# Patient Record
Sex: Female | Born: 1985 | Race: White | Hispanic: No | Smoking: Current every day smoker
Health system: Southern US, Community
[De-identification: ages and names within clinical notes are randomized; demographics above are authoritative.]

## PROBLEM LIST (undated history)

## (undated) HISTORY — PX: MANDIBLE SURGERY: SHX707

## (undated) HISTORY — PX: CHOLECYSTECTOMY: SHX55

---
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
## 2005-12-06 ENCOUNTER — Emergency Department: Payer: Self-pay | Admitting: Unknown Physician Specialty

## 2007-10-24 ENCOUNTER — Emergency Department: Payer: Self-pay | Admitting: Emergency Medicine

## 2007-11-10 ENCOUNTER — Emergency Department: Payer: Self-pay

## 2008-06-05 ENCOUNTER — Emergency Department: Payer: Self-pay | Admitting: Emergency Medicine

## 2009-08-20 ENCOUNTER — Emergency Department: Payer: Self-pay | Admitting: Emergency Medicine

## 2009-10-28 ENCOUNTER — Observation Stay: Payer: Self-pay | Admitting: Surgery

## 2010-05-24 ENCOUNTER — Emergency Department: Payer: Self-pay | Admitting: Emergency Medicine

## 2010-12-06 ENCOUNTER — Emergency Department: Payer: Self-pay | Admitting: Emergency Medicine

## 2011-03-14 ENCOUNTER — Emergency Department: Payer: Self-pay | Admitting: Emergency Medicine

## 2011-03-14 LAB — COMPREHENSIVE METABOLIC PANEL
Albumin: 4.1 g/dL (ref 3.4–5.0)
BUN: 7 mg/dL (ref 7–18)
Calcium, Total: 9.2 mg/dL (ref 8.5–10.1)
Chloride: 106 mmol/L (ref 98–107)
Co2: 25 mmol/L (ref 21–32)
EGFR (African American): >60
EGFR (Non-African Amer.): >60
Glucose: 92 mg/dL (ref 65–99)
SGOT(AST): 16 U/L (ref 15–37)
SGPT (ALT): 21 U/L
Total Protein: 8 g/dL (ref 6.4–8.2)

## 2011-03-14 LAB — HCG, QUANTITATIVE, PREGNANCY: Beta Hcg, Quant.: 31703 m[IU]/mL — ABNORMAL HIGH

## 2011-03-14 LAB — URINALYSIS, COMPLETE
Bilirubin,UR: NEGATIVE
Blood: NEGATIVE
Glucose,UR: NEGATIVE mg/dL (ref 0–75)
Ketone: NEGATIVE
Leukocyte Esterase: NEGATIVE
Ph: 7 (ref 4.5–8.0)
Protein: NEGATIVE
RBC,UR: 2 /HPF (ref 0–5)
Specific Gravity: 1.019 (ref 1.003–1.030)
Squamous Epithelial: 4

## 2011-03-14 LAB — CBC
HGB: 12.8 g/dL (ref 12.0–16.0)
MCH: 30 pg (ref 26.0–34.0)
Platelet: 141 10*3/uL — ABNORMAL LOW (ref 150–440)
RBC: 4.28 10*6/uL (ref 3.80–5.20)
RDW: 14 % (ref 11.5–14.5)
WBC: 6.9 10*3/uL (ref 3.6–11.0)

## 2011-05-13 ENCOUNTER — Emergency Department: Payer: Self-pay | Admitting: Internal Medicine

## 2011-09-23 ENCOUNTER — Observation Stay: Payer: Self-pay | Admitting: Obstetrics and Gynecology

## 2011-09-23 LAB — WET PREP, GENITAL

## 2011-09-23 LAB — URINALYSIS, COMPLETE
Blood: NEGATIVE
Glucose,UR: NEGATIVE mg/dL (ref 0–75)
Leukocyte Esterase: NEGATIVE
Nitrite: NEGATIVE
Protein: NEGATIVE
RBC,UR: 1 /HPF (ref 0–5)
Specific Gravity: 1.019 (ref 1.003–1.030)
Squamous Epithelial: 2

## 2011-09-23 LAB — FETAL FIBRONECTIN: Appearance: NORMAL

## 2011-09-24 ENCOUNTER — Ambulatory Visit: Payer: Self-pay | Admitting: Obstetrics & Gynecology

## 2011-11-08 ENCOUNTER — Observation Stay: Payer: Self-pay | Admitting: Obstetrics & Gynecology

## 2011-11-09 ENCOUNTER — Inpatient Hospital Stay: Payer: Self-pay | Admitting: Obstetrics & Gynecology

## 2011-11-09 LAB — CBC WITH DIFFERENTIAL/PLATELET
Basophil %: 0.3 %
Eosinophil #: 0.1 10*3/uL (ref 0.0–0.7)
Eosinophil %: 1 %
HCT: 35.1 % (ref 35.0–47.0)
Lymphocyte #: 2.4 10*3/uL (ref 1.0–3.6)
MCH: 30.6 pg (ref 26.0–34.0)
MCHC: 34.3 g/dL (ref 32.0–36.0)
MCV: 89 fL (ref 80–100)
Monocyte #: 1 x10 3/mm — ABNORMAL HIGH (ref 0.2–0.9)
Monocyte %: 7.8 %
Neutrophil #: 8.9 10*3/uL — ABNORMAL HIGH (ref 1.4–6.5)
Neutrophil %: 71.7 %
RBC: 3.93 10*6/uL (ref 3.80–5.20)
RDW: 13.6 % (ref 11.5–14.5)
WBC: 12.4 10*3/uL — ABNORMAL HIGH (ref 3.6–11.0)

## 2011-11-10 LAB — HEMATOCRIT: HCT: 31.1 % — ABNORMAL LOW (ref 35.0–47.0)

## 2011-11-15 LAB — PATHOLOGY REPORT

## 2013-02-14 IMAGING — US US OB < 14 WEEKS - US OB TV
1 series · 17 of 28 positions shown · non-contrast
Comparison: none

REASON FOR EXAM: Pain & Vomiting with Pelvic pain. 6 weeks OB
COMMENTS:

PROCEDURE:     US  - US OB LESS THAN 14 WEEKS/W TRANS  - March 14, 2011  [DATE]
RESULT:     First trimester OB ultrasound dated tube 12/21/1911.

[Series 1: us ob < 14 weeks - us ob tv · 17 of 102 slices shown]
[im 1/102]
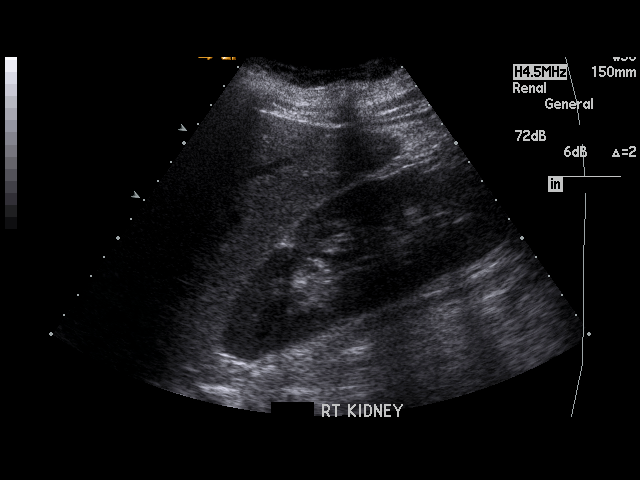
[im 8/102]
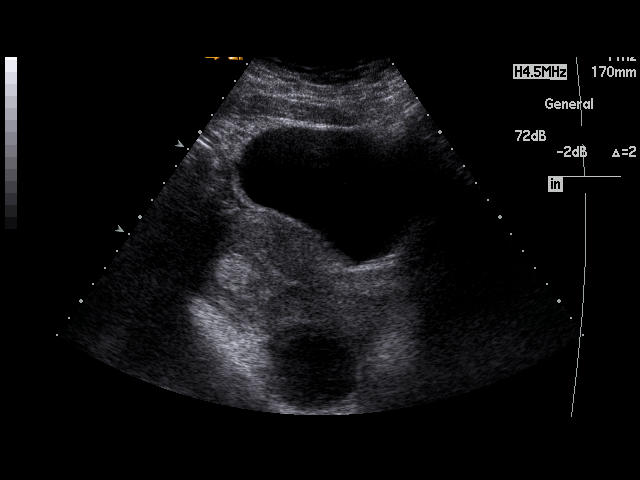
[im 15/102]
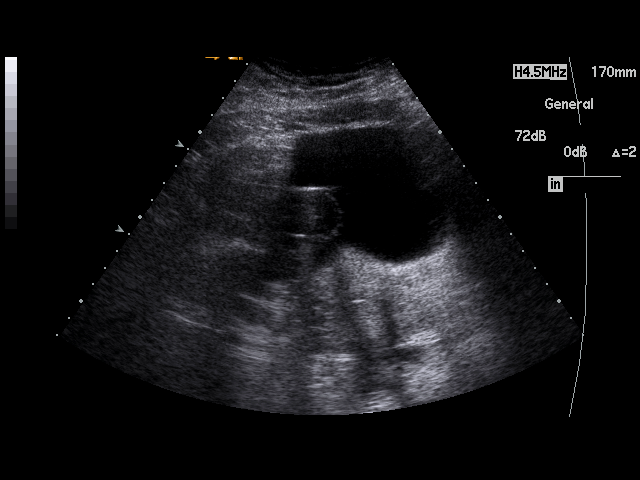
[im 19/102]
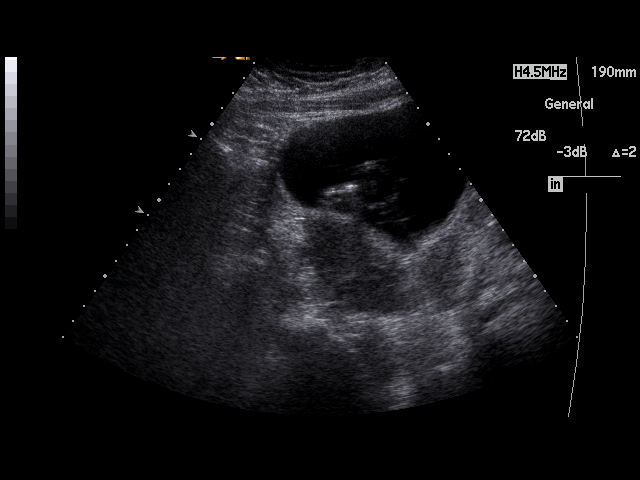
[im 27/102]
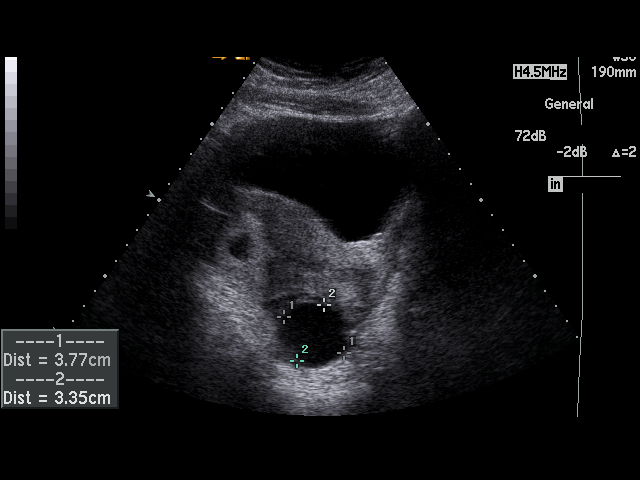
[im 34/102]
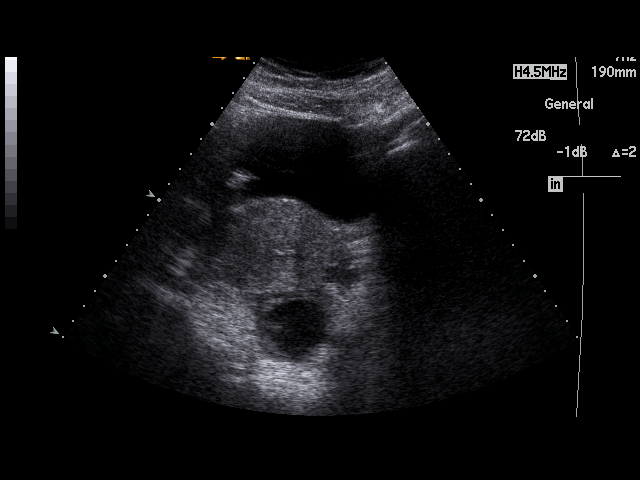
[im 38/102]
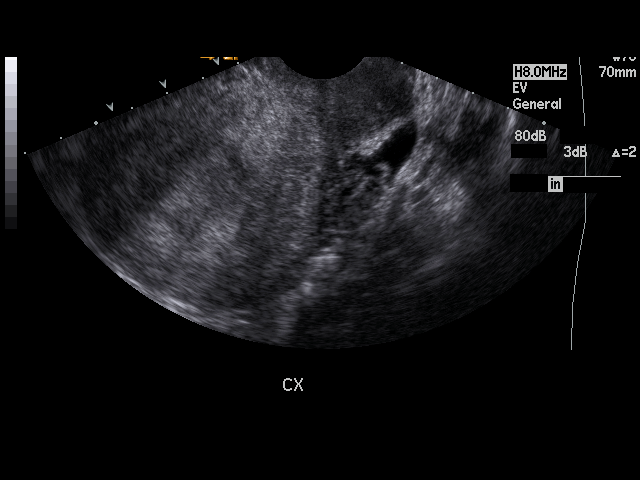
[im 45/102]
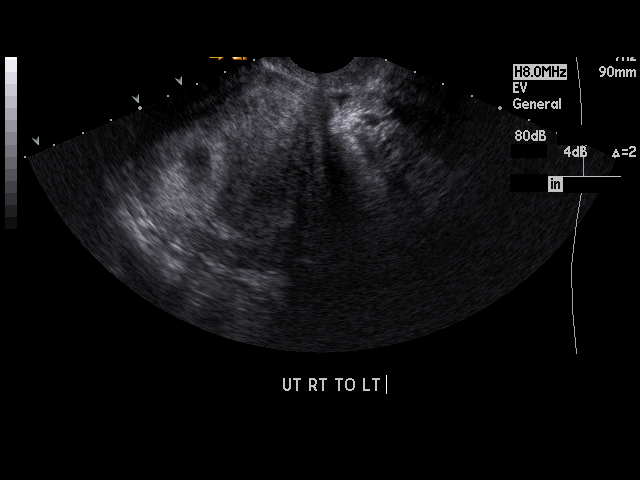
[im 53/102]
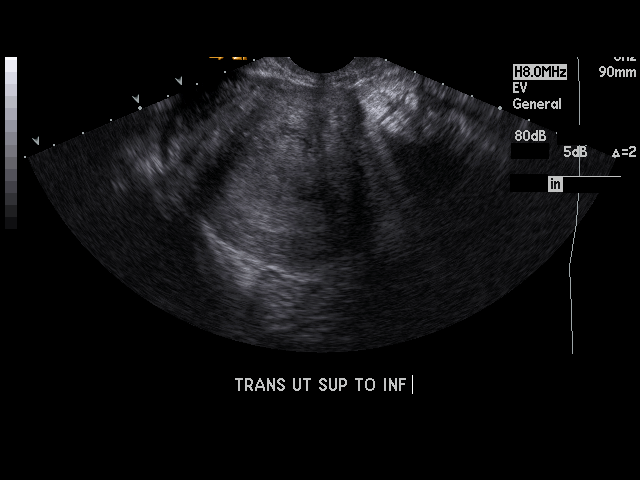
[im 57/102]
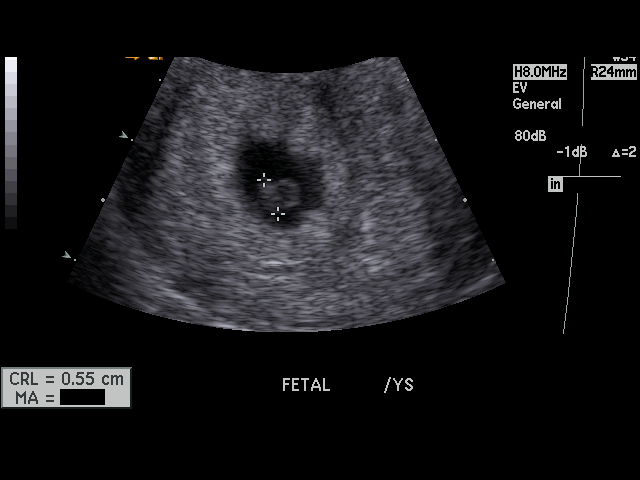
[im 64/102]
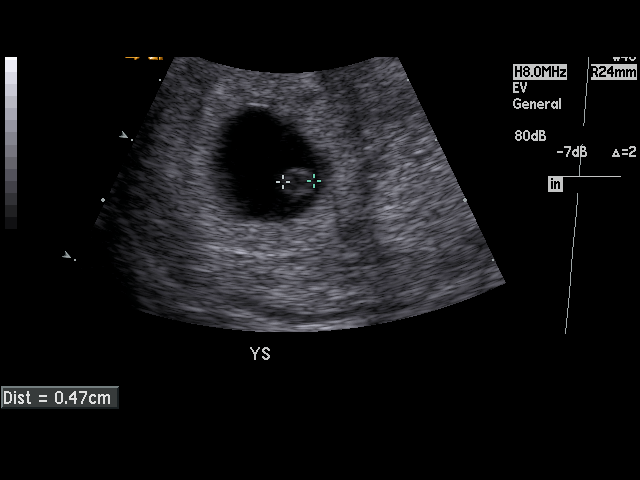
[im 68/102]
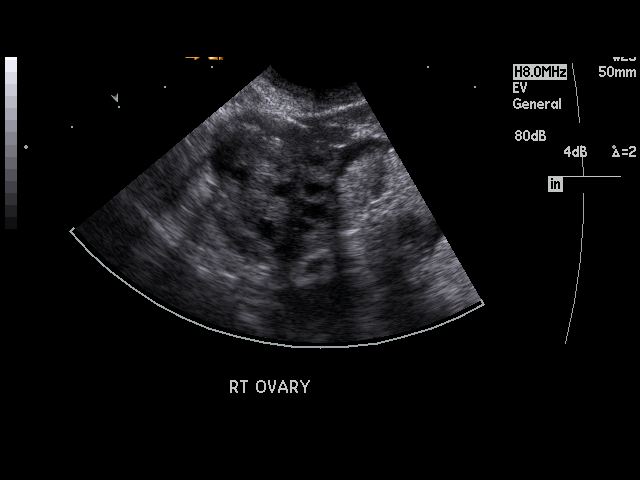
[im 75/102]
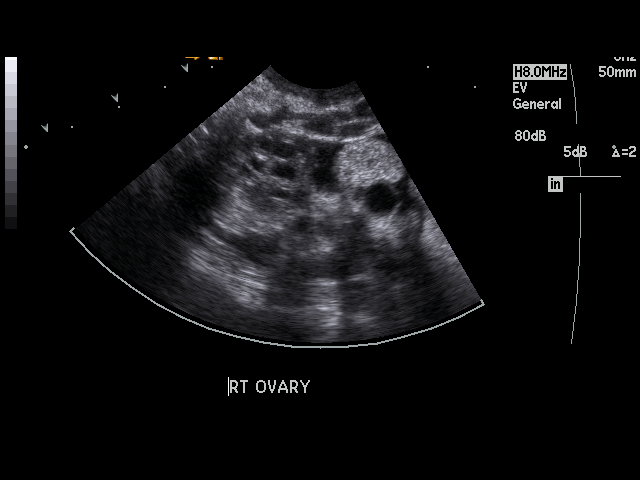
[im 83/102]
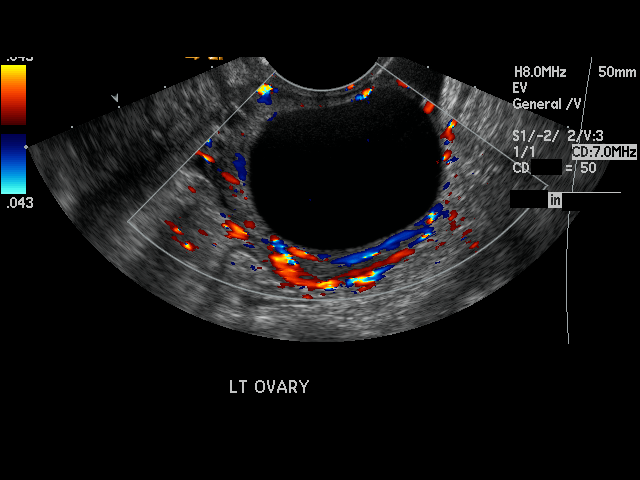
[im 87/102]
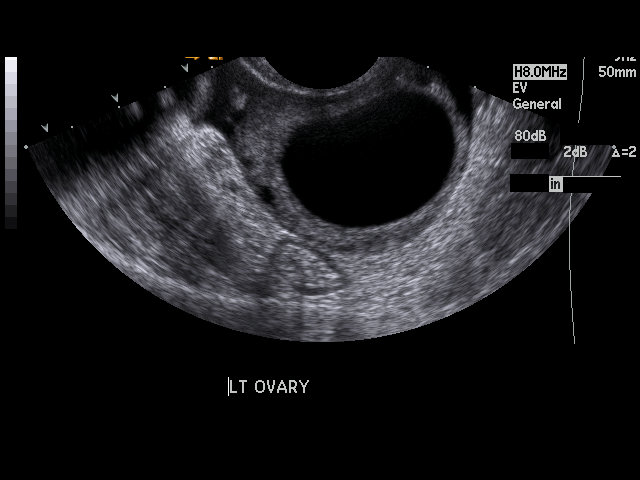
[im 94/102]
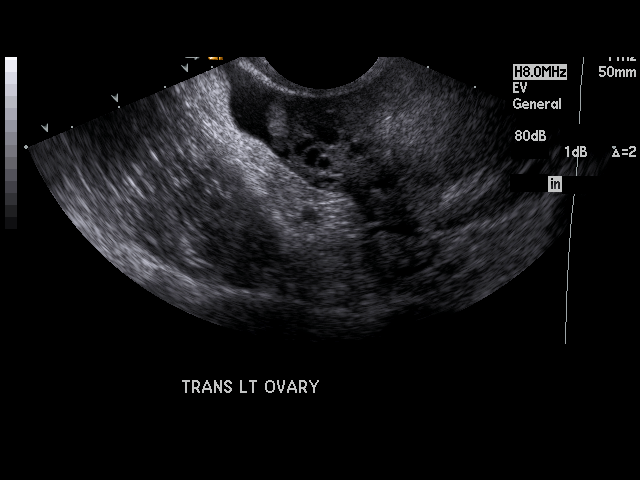
[im 102/102]
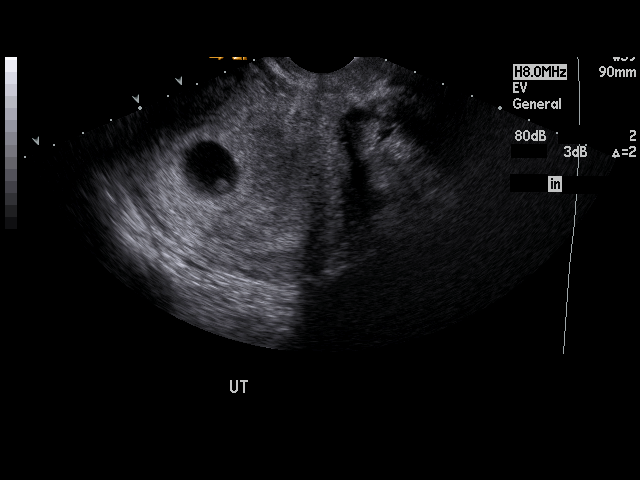

[17 of 28 positions shown; findings below may reference images not displayed]

FINDINGS: A single viable intrauterine pregnancy is appreciated with
estimated fetal heart rate of 114 beats per minute. Estimated gestational
age is 6 weeks 3 days based upon a crown-rump rump length of 5.8 mm. Free
fluid is identified within the adnexal regions. The right ovary measures
3.14 x 2.43 x 1.97 cm and the left 4.1 x 3.75 x 4.5 cm. A right ovarian cyst
is identified measuring 4.21 x 3 x 4.47 cm. This has a simple appearance
with an imperceptible wall, increased through transmission and absent
vascularity. The left ovary is unremarkable follicles are appreciated.
IMPRESSION: Single viable intrauterine pregnancy.
2. Large right ovarian cyst surveillance evaluation in 6-8 weeks recommended.
3. Small amount of adnexal free fluid.

## 2014-05-20 NOTE — Op Note (Signed)
PATIENT NAME:  Jordan Huerta, Jordan Huerta MR#:  [id number] DATE OF BIRTH:  [birth year]  DATE OF PROCEDURE:  11/10/2011  PREOPERATIVE DIAGNOSES:  46. 29 year old G5, P2-0-3-2 postpartum day #1 from a term spontaneous vaginal delivery.  2. Desires permanent surgical sterilization.  POSTOPERATIVE DIAGNOSES:  26. 29 year old G5, P2-0-3-2 postpartum day #1 from a term spontaneous vaginal delivery.  2. Desires permanent surgical sterilization. 3. Small 3 mm solid nodule in the left fallopian tube which was excised with that portion of fallopian tube segment.   PROCEDURE PERFORMED: Postpartum bilateral tubal ligation via Pomeroy method.   ANESTHESIA USED: General.   PRIMARY SURGEON: Florina Ou. Bonney Aid, MD  ESTIMATED BLOOD LOSS: Minimal.   OPERATIVE FLUIDS: 1 liter of crystalloid.   COMPLICATIONS: None.   INTRAOPERATIVE FINDINGS: Normal tubal fimbria bilaterally. The left fallopian tube segment contained a solid cystic area approximately 3 mm in size which was excised with that portion of fallopian tube.   DRAINS OR TUBES: None.   ANTIBIOTICS: None.   SPECIMENS REMOVED: Portion of right and left tube.   PATIENT CONDITION FOLLOWING PROCEDURE: Stable.   PROCEDURE IN DETAIL: Risks, benefits and alternatives of the procedure as well as the permanent nature of the procedure were discussed with the patient prior to proceeding to the Operating Room. The patient was administered general anesthesia and positioned in the supine position. She was prepped and draped in the usual sterile fashion. A 3 cm vertical skin incision was made at the base of the umbilicus. This was carried down to the level of the rectus fascia. The subcutaneous tissue was dissected off the fascia bluntly using a hemostat. The fascia was grasped with a Kocher clamp and then regrasped with a second Kocher clamp. After letting go with the first Kocher clamp and regrasping to ensure no underlying loops of bowel were within the clamps Mayo  scissors were used to incise the fascia. Upon incising the fascia the fascial edges were tagged with a 0 Vicryl on a GU needle. The peritoneum was identified, grasped with hemostat, tented up and incised using Metzenbaum scissors. The peritoneal incision was then extended using manual traction. The patient was airplane to the left. The right fallopian tube was then attempted to be visualized using [employer] retractors, however, given that the underlying omentum was obscuring the operative field, two moist mini laps were used to pack away the omentum and bowel. The right tube was then visualized, walked out to its fimbriated end before being walked back to mid isthmic portion. The mid isthmic portion of fallopian tube was tented up and a knuckle of tube was doubly suture ligated using a 0 chromic wheel. The knuckle of tube was then excised using Metzenbaum scissors. Tubal ostia were visualized bilaterally and the tube was noted to be hemostatic before returning it to the abdomen. The same procedure was then repeated likewise on the left, however, on the portion of the left tube a small 3 mm nodule which was solid in appearance was noted. This was included in the excision of the left knuckle of fallopian tube. The tube was inspected, noted to be hemostatic before being returned to the abdomen. The fascial incision was then closed using the previously placed stay sutures, however, there was still some defect which was closed with another figure-of-eight of 0 Vicryl. Following fascial closure, the subcutaneous tissue was closed using a 4-0 Monocryl in a subcuticular fashion and the skin was dressed using Dermabond. Sponge, needle, and instrument counts were correct x2. The patient tolerated  the procedure well, was taken to the recovery room in stable condition.   ____________________________ Florina OuAndreas M. Bonney AidStaebler, MD ams:cms D: 11/10/2011 20:06:00 ET T: 11/11/2011 09:48:40 ET JOB#: [id number]  cc: Florina OuAndreas M.  Bonney AidStaebler, MD, <Dictator> Carmel SacramentoANDREAS Cathrine MusterM Bernece Gall MD ELECTRONICALLY SIGNED 11/21/2011 21:35

## 2014-06-10 NOTE — H&P (Signed)
L&D Evaluation:  History:   HPI 29 yo G5P1031 at 5667w3d gestational age by LMP consistent with 12 week ultrasound presents for 2-3 days of worsening pelvic pressure with standing. The pain is relieved by lying down.  She has had an uncomplicated pregnancy up to this point.notes positive fetal movement, denies contractions, leakage of fluid, and vaginal bleeding.  Blood type A+, RI, VZI, HBsAg neg, RPR NR    Patient's Medical History No Chronic Illness    Patient's Surgical History Laparoscopic cholecystectomy    Medications Pre Natal Vitamins    Allergies NKDA    Family History Non-Contributory   ROS:   ROS All systems were reviewed.  HEENT, CNS, GI, GU, Respiratory, CV, Renal and Musculoskeletal systems were found to be normal., unless noted in HPI   Exam:   Vital Signs stable    General no apparent distress    Mental Status clear    Chest clear    Heart normal sinus rhythm    Abdomen gravid, non-tender    Estimated Fetal Weight Average for gestational age    Back no CVAT    Edema no edema    Pelvic no external lesions, cervix 1/20/-3 (1515pm) => 1/20/-3 (1945pm)    Mebranes Intact    FHT normal rate with no decels    FHT Description 130/mod var/+accels/no decels    Ucx absent    Skin no lesions    Other UA: nitr and LE=neg, bact=neg, Epis=2/hpf fFN=POS [place] = neg clue cells, fungal elements, trichamonas   Impression:   Impression reactive NST, Dilated cervix with no evidence of active labor currently   Plan:   Plan UA, EFM/NST, monitor contractions and for cervical change, discharge    Comments -Will administer BMTZ dose #1 tonight and have her return tomorrow evening at about 8pm for dose #2.   - Pelvic rest and modified bed rest [facility] - Discussed having a VERY low threshold for returning to L&D should her pelvic pressure worsen or any new concerning symptoms develop.   - A positive fFN is not a reliable  indicator or preterm delivery.  A positive result predicts about a 30% risk of preterm delivery.  This is one of the best predictors we have.  That is why I have decided to give her betamethasone during this window when she has no apparent active labor.  Her symptoms of pelvic pressure disappear completely when she is recumbant.    Follow Up Appointment already scheduled. Monday 8/26   Electronic Signatures: Conard NovakJackson, Leland Staszewski D (MD)  (Signed 23-Aug-13 20:09)  Authored: L&D Evaluation   Last Updated: 23-Aug-13 20:09 by Conard NovakJackson, Adrie Picking D (MD)

## 2014-06-10 NOTE — H&P (Signed)
L&D Evaluation:  History Expanded:   HPI 29 yo G5P1031 whose EDC = 10/8.  [facility].  Pt presents in advanced labor.    Blood Type (Maternal) A positive    Group B Strep Results Maternal (Result >5wks must be treated as unknown) positive    Maternal HIV Negative    Maternal Syphilis Ab Nonreactive    Maternal Varicella Immune    Rubella Results (Maternal) immune    Presents with abdominal pain    Patient's Medical History No Chronic Illness    Patient's Surgical History Colecystectomy    Medications Pre Natal Vitamins    Allergies NKDA    Social History tobacco   Exam:   Vital Signs stable    General no apparent distress    Mental Status clear    Chest clear    Heart normal sinus rhythm    Abdomen gravid, non-tender    Estimated Fetal Weight Average for gestational age    Fetal Position Vtx    Pelvic 7-8 cm    Mebranes AROM - clear    Description clear    FHT normal rate with no decels   Impression:   Impression active labor   Plan:   Plan antibiotics for GBBS prophylaxis   Electronic Signatures: Towana Badgerosenow, Rabia Argote J (MD)  (Signed 09-Oct-13 20:56)  Authored: L&D Evaluation   Last Updated: 09-Oct-13 20:56 by Towana Badgerosenow, Uriyah Raska J (MD)

## 2015-05-18 ENCOUNTER — Other Ambulatory Visit: Payer: Self-pay | Admitting: Specialist

## 2015-05-18 DIAGNOSIS — M25561 Pain in right knee: Secondary | ICD-10-CM

## 2015-05-29 ENCOUNTER — Ambulatory Visit
Admission: RE | Admit: 2015-05-29 | Discharge: 2015-05-29 | Disposition: A | Discharge status: Home / Self Care | Payer: Medicaid Other | Source: Ambulatory Visit | Attending: Specialist | Admitting: Specialist

## 2015-05-29 DIAGNOSIS — M25561 Pain in right knee: Secondary | ICD-10-CM | POA: Diagnosis not present

## 2015-05-29 DIAGNOSIS — M9250 Juvenile osteochondrosis of tibia and fibula, unspecified leg: Secondary | ICD-10-CM | POA: Diagnosis not present

## 2015-06-17 ENCOUNTER — Ambulatory Visit: Payer: Medicaid Other | Attending: Orthopedic Surgery

## 2016-07-20 ENCOUNTER — Encounter: Payer: Self-pay | Admitting: Emergency Medicine

## 2016-07-20 ENCOUNTER — Emergency Department
Admission: EM | Admit: 2016-07-20 | Discharge: 2016-07-20 | Disposition: A | Discharge status: Home / Self Care | Payer: Medicaid Other | Attending: Emergency Medicine | Admitting: Emergency Medicine

## 2016-07-20 DIAGNOSIS — F17200 Nicotine dependence, unspecified, uncomplicated: Secondary | ICD-10-CM | POA: Insufficient documentation

## 2016-07-20 DIAGNOSIS — R21 Rash and other nonspecific skin eruption: Secondary | ICD-10-CM

## 2016-07-20 MED ORDER — CLINDAMYCIN HCL 300 MG PO CAPS: 300.0000 mg | ORAL_CAPSULE | Freq: Three times a day (TID) | ORAL | 0 refills | Status: AC

## 2016-07-20 MED ORDER — METHYLPREDNISOLONE SODIUM SUCC 125 MG IJ SOLR
125.0000 mg | Freq: Once | INTRAMUSCULAR | Status: AC
  Administered 2016-07-20: 125 mg via INTRAMUSCULAR
  Filled 2016-07-20: qty 2

## 2016-07-20 MED ORDER — PREDNISONE 10 MG PO TABS: ORAL_TABLET | ORAL | 0 refills | Status: AC

## 2016-07-20 NOTE — ED Notes (Signed)
Pt reports that she has a rash on the right side of her face - she states the area started Monday and has started to grow and swell - she thinks the area is a spider bite - no drainage from the area but it is red/swollen at this time - she has been using hydrocortisone cream that seems to be helping some

## 2016-07-20 NOTE — ED Triage Notes (Signed)
Says small area of redness on right face since Monday.  Has another area that has been there a long time near it.

## 2016-07-20 NOTE — ED Provider Notes (Signed)
[facility] Emergency Department Provider Note  ____________________________________________  Time seen: Approximately 4:28 PM  I have reviewed the triage vital signs and the nursing notes.   HISTORY  Chief Complaint Rash    HPI Jordan Huerta is a 31 y.o. female that presents to the emergency department with a rash on her cheek for 3 days. Patient statesthat she went to the beach on Monday and shortly after developed a rash on the right side of her face. She has a similar rash at the back of her cheek that has been there for years. No tick bite. She does not recall being bitten by an insect. She has been applying hydrocortisone cream, with minimal relief. She does not have insurance so she has not been able to follow up with dermatology regarding previous rash. She denies fever, shortness breath, chest pain, nausea, vomiting, abdominal pain.   History reviewed. No pertinent past medical history.  There are no active problems to display for this patient.   Past Surgical History:  Procedure Laterality Date  . CHOLECYSTECTOMY    . MANDIBLE SURGERY     injected bone in jaw    Prior to Admission medications   Medication Sig Start Date End Date Taking? Authorizing Provider  clindamycin (CLEOCIN) 300 MG capsule Take 1 capsule (300 mg total) by mouth 3 (three) times daily. 07/20/16 07/30/16  Enid Derry, PA-C  predniSONE (DELTASONE) 10 MG tablet Take 6 tablets on day 1, take 5 tablets on day 2, take 4 tablets on day 3, take 3 tablets on day 4, take 2 tablets on day 5, take 1 tablet on day 6 07/20/16   Enid Derry, PA-C    Allergies Patient has no known allergies.  No family history on file.  Social History Social History  Substance Use Topics  . Smoking status: Current Every Day Smoker  . Smokeless tobacco: Never Used  . Alcohol use Yes     Review of Systems  Constitutional: No fever/chills Cardiovascular: No chest pain. Respiratory: No  SOB. Gastrointestinal: No abdominal pain.  No nausea, no vomiting.  Musculoskeletal: Negative for musculoskeletal pain. Skin: Negative for abrasions, lacerations, ecchymosis. Positive for rash. Neurological: Negative for headaches, numbness or tingling   ____________________________________________   PHYSICAL EXAM:  VITAL SIGNS: ED Triage Vitals  Enc Vitals Group     BP 07/20/16 1544 (!) 141/98     Pulse Rate 07/20/16 1544 (!) 111     Resp 07/20/16 1544 14     Temp 07/20/16 1544 98.4 F (36.9 C)     Temp Source 07/20/16 1544 Oral     SpO2 07/20/16 1544 100 %     Weight 07/20/16 1544 170 lb (77.1 kg)     Height 07/20/16 1544 5\' 8"  (1.727 m)     Head Circumference --      Peak Flow --      Pain Score 07/20/16 1543 5     Pain Loc --      Pain Edu? --      Excl. in GC? --      Constitutional: Alert and oriented. Well appearing and in no acute distress. Eyes: Conjunctivae are normal. PERRL. EOMI. Head: Atraumatic. ENT:      Ears:      Nose: No congestion/rhinnorhea.      Mouth/Throat: Mucous membranes are moist.  Neck: No stridor.   Cardiovascular: Normal rate, regular rhythm.  Good peripheral circulation. Respiratory: Normal respiratory effort without tachypnea or retractions. Lungs CTAB. Good  air entry to the bases with no decreased or absent breath sounds. Gastrointestinal: Bowel sounds 4 quadrants. Soft and nontender to palpation. No guarding or rigidity. No palpable masses. No distention. Musculoskeletal: Full range of motion to all extremities. No gross deformities appreciated. Neurologic:  Normal speech and language. No gross focal neurologic deficits are appreciated.  Skin:  Skin is warm, dry and intact. 1 cm circular area of erythema to anterior right cheek with 1 mm scab in the center. 1 cm circular area of erythema to posterior cheek.   ____________________________________________   LABS (all labs ordered are listed, but only abnormal results are  displayed)  Labs Reviewed - No data to display ____________________________________________  EKG   ____________________________________________  RADIOLOGY   No results found.  ____________________________________________    PROCEDURES  Procedure(s) performed:    Procedures    Medications  methylPREDNISolone sodium succinate (SOLU-MEDROL) 125 mg/2 mL injection 125 mg (125 mg Intramuscular Given 07/20/16 1639)     ____________________________________________   INITIAL IMPRESSION / ASSESSMENT AND PLAN / ED COURSE  Pertinent labs & imaging results that were available during my care of the patient were reviewed by me and considered in my medical decision making (see chart for details).  Review of the [place] CSRS was performed in accordance of the [facility] prior to dispensing any controlled drugs.   Patient presented to the emergency department for evaluation of rash on right cheek. Vital signs and exam are reassuring.  It appears like she was bitten on the cheek by an insect and has some surrounding cellulitis. She was given solumedrol in ED. Patient will be discharged home with prescriptions for prednisone and clindamycin. Patient is to follow up with PCP as directed. Patient is given ED precautions to return to the ED for any worsening or new symptoms.     ____________________________________________  FINAL CLINICAL IMPRESSION(S) / ED DIAGNOSES  Final diagnoses:  Rash and nonspecific skin eruption      NEW MEDICATIONS STARTED DURING THIS VISIT:  Discharge Medication List as of 07/20/2016  4:50 PM    START taking these medications   Details  clindamycin (CLEOCIN) 300 MG capsule Take 1 capsule (300 mg total) by mouth 3 (three) times daily., Starting Wed 07/20/2016, Until Sat 07/30/2016, Print    predniSONE (DELTASONE) 10 MG tablet Take 6 tablets on day 1, take 5 tablets on day 2, take 4 tablets on day 3, take 3 tablets on day 4, take 2 tablets on day 5, take 1 tablet  on day 6, Print            This chart was dictated using voice recognition software/Dragon. Despite best efforts to proofread, errors can occur which can change the meaning. Any change was purely unintentional.    Enid DerryWagner, Lasonia Casino, PA-C 07/20/16 1806    Minna AntisPaduchowski, Kevin, MD 07/20/16 2151

## 2017-05-01 IMAGING — MR MR KNEE*R* W/O CM
5 series · 39 of 40 positions shown · non-contrast
Comparison: None.

CLINICAL DATA: Constant right knee pain with mild swelling.
Symptoms are worse with standing and going up and down stairs. Pain
is chronic but has worsened over the past 6 months.

EXAM:
MRI OF THE RIGHT KNEE WITHOUT CONTRAST
TECHNIQUE: Multiplanar, multisequence MR imaging of the knee was performed. No
intravenous contrast was administered.

[Series 3: PD fat-sat · axial · 3.0mm · 0.31mm/px · z∈[-87,+29]mm · 8 of 36 slices shown (1 of 3)]
[im 1/36]
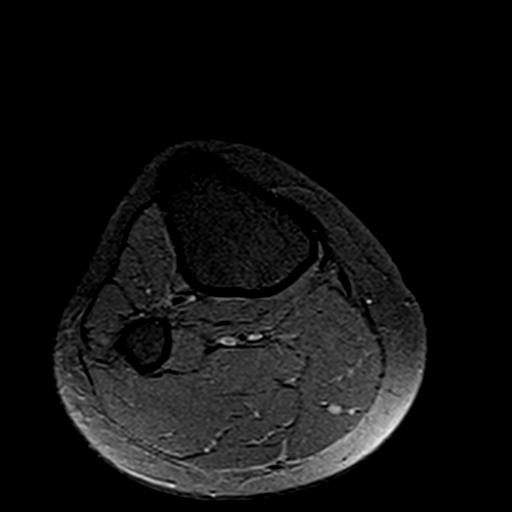
[im 6/36]
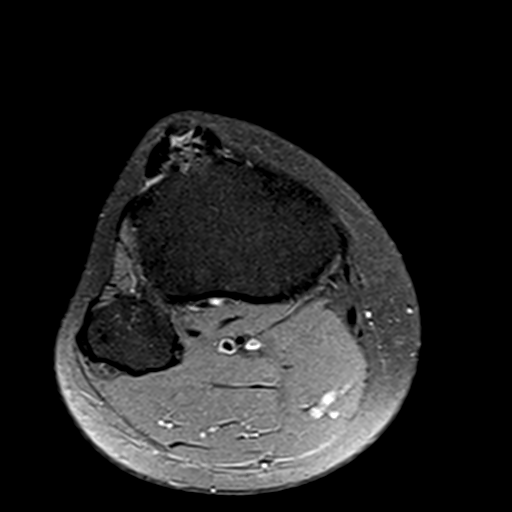
[im 11/36]
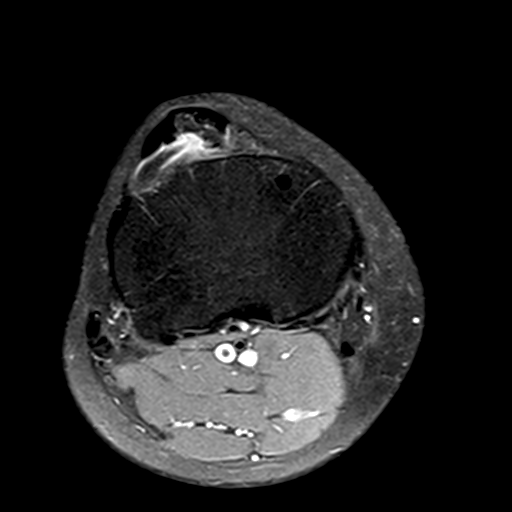
[im 16/36]
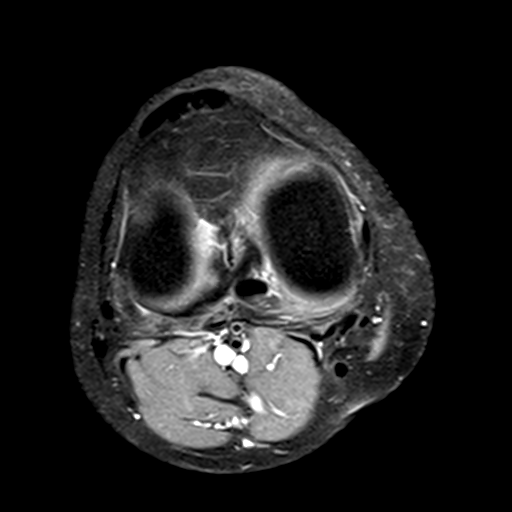
[im 21/36]
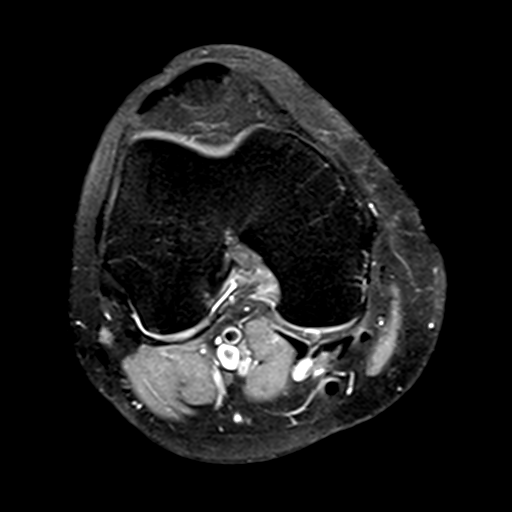
[im 26/36]
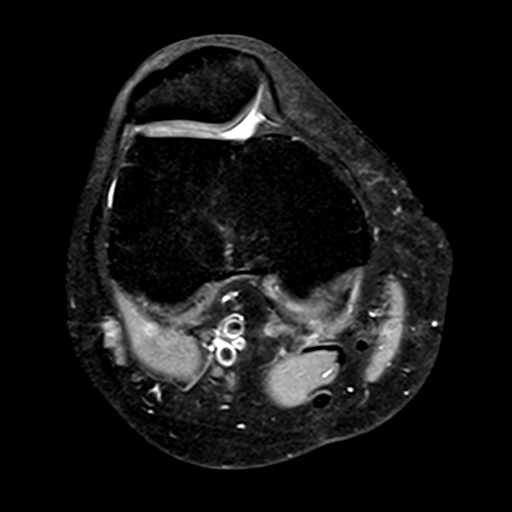
[im 31/36]
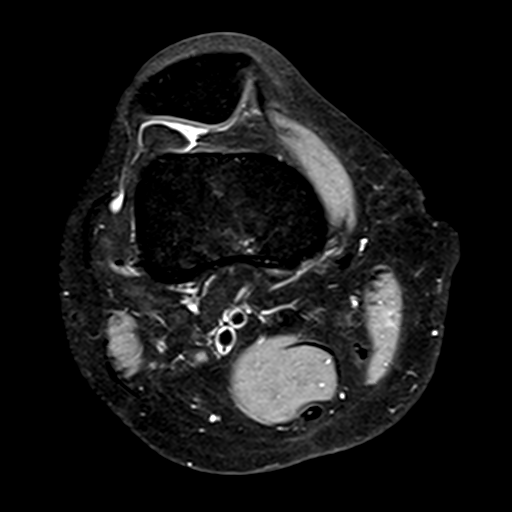
[im 36/36]
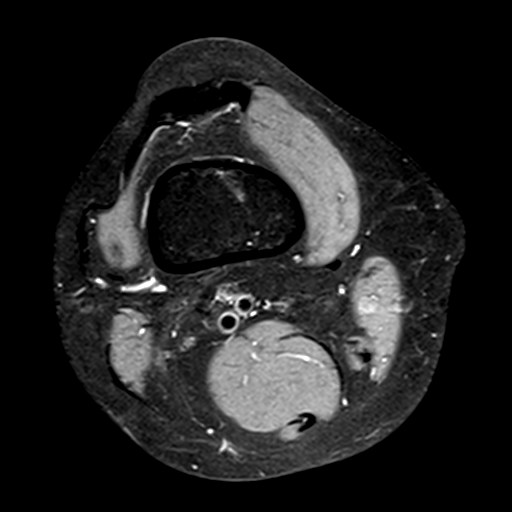

[Series 4: T1 · coronal · 3.0mm · 0.50mm/px · 7 of 34 slices shown]
[im 1/34]
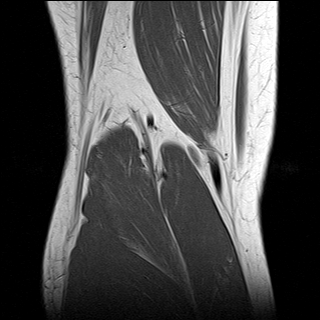
[im 5/34]
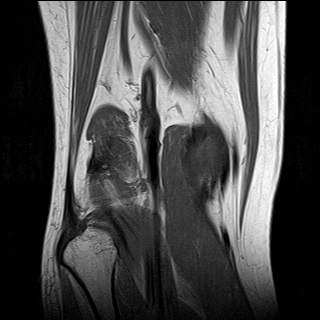
[im 10/34]
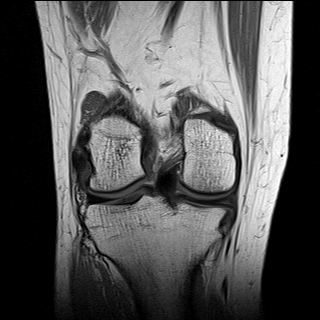
[im 15/34]
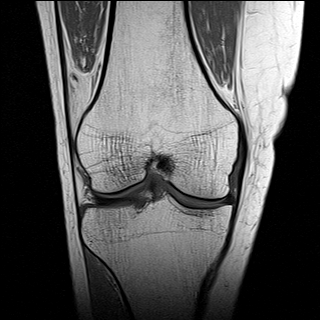
[im 19/34]
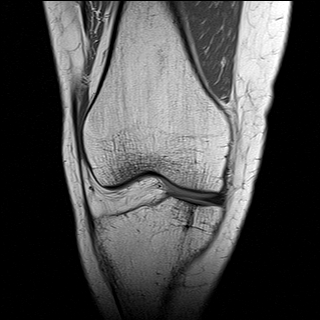
[im 24/34]
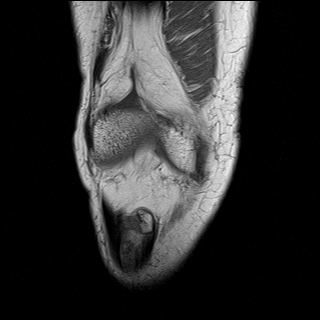
[im 29/34]
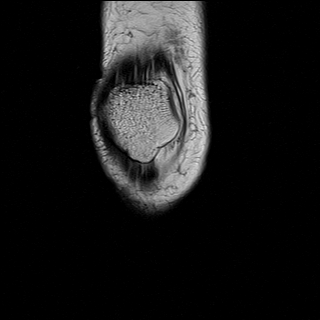

[Series 5: PD fat-sat · sagittal · 3.0mm · 0.62mm/px · 8 of 32 slices shown (2 of 3)]
[im 1/32]
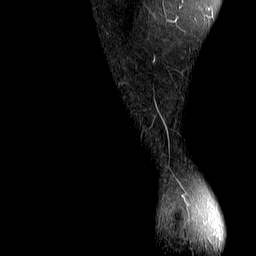
[im 5/32]
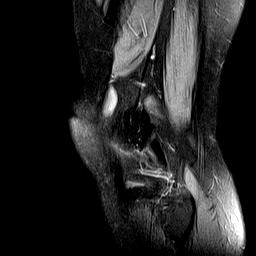
[im 9/32]
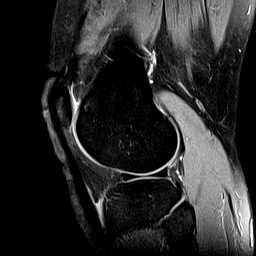
[im 14/32]
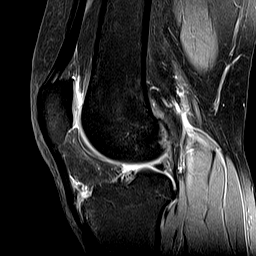
[im 18/32]
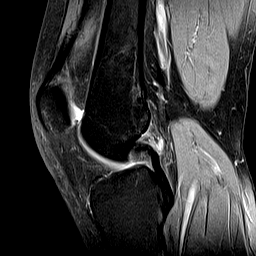
[im 23/32]
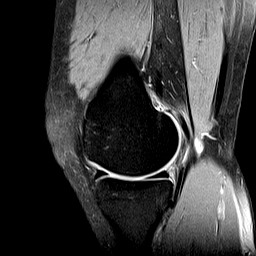
[im 27/32]
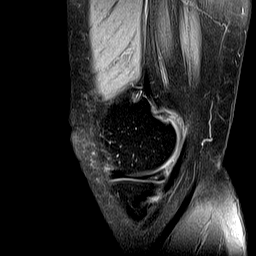
[im 32/32]
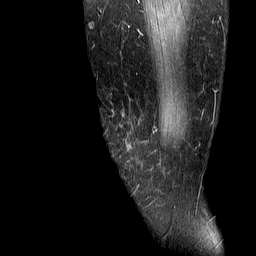

[Series 6: T2 fat-sat · coronal · 3.0mm · 0.50mm/px · 8 of 34 slices shown]
[im 1/34]
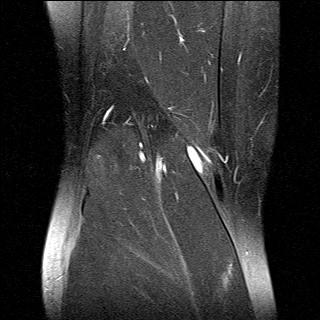
[im 5/34]
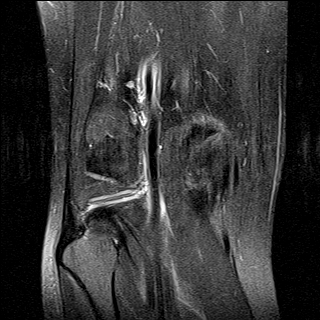
[im 10/34]
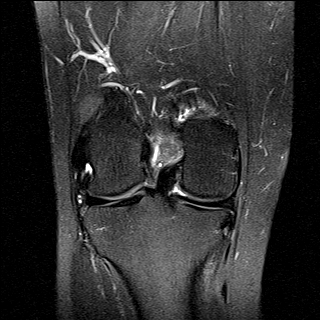
[im 15/34]
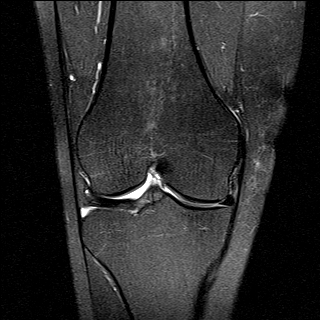
[im 19/34]
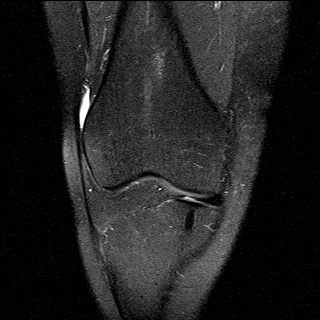
[im 24/34]
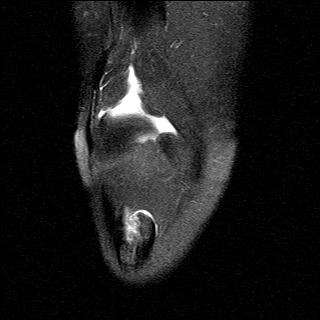
[im 29/34]
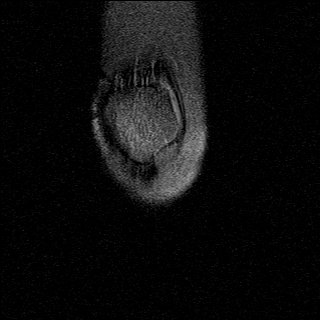
[im 34/34]
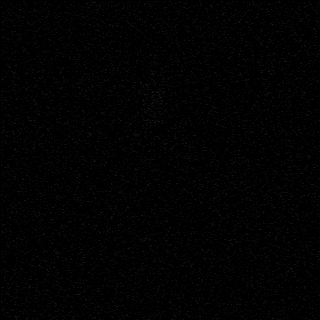

[Series 7: PD fat-sat · coronal · 3.0mm · 0.62mm/px · 8 of 34 slices shown (3 of 3)]
[im 1/34]
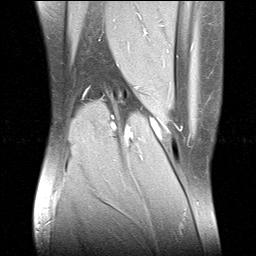
[im 5/34]
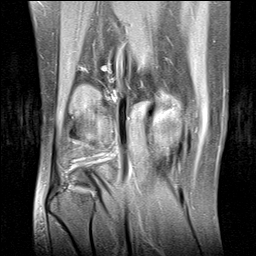
[im 10/34]
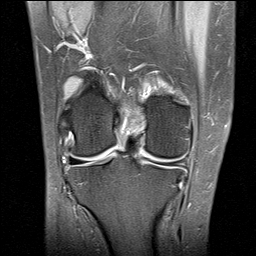
[im 15/34]
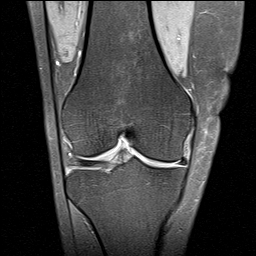
[im 19/34]
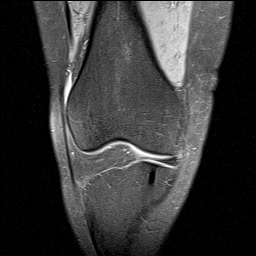
[im 24/34]
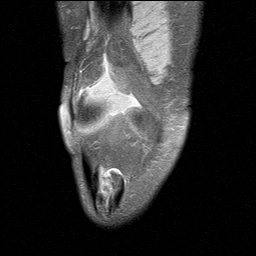
[im 29/34]
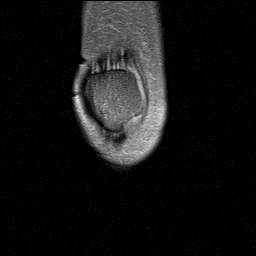
[im 34/34]
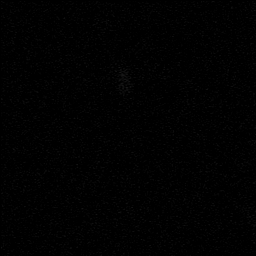

[39 of 40 positions shown; findings below may reference images not displayed]

FINDINGS: MENISCI

Medial meniscus:  Intact.

Lateral meniscus:  Intact.

LIGAMENTS

Cruciates:  Intact.

Collaterals:  Intact.

CARTILAGE

Patellofemoral:  Unremarkable.

Medial:  Unremarkable.

Lateral:  Unremarkable.

Joint:  Trace amount of joint fluid.

Popliteal Fossa:  Tiny Baker's cyst.

Extensor Mechanism: The patient has marked Osgood-Schlatter disease.
The tibial tuberosity is fragmented with marrow edema within it.
Cystic change is seen in the underlying tibia. Fluid is present in
the deep infrapatellar bursa. The patellar tendon is intact. The
extensor mechanism is otherwise unremarkable.

Bones:  As described above.  Otherwise normal.
IMPRESSION: Severe appearing Osgood-Schlatter disease. The examination is
otherwise negative.
